# Patient Record
Sex: Female | Born: 1955 | Race: White | Hispanic: Yes | Smoking: Former smoker
Health system: Southern US, Community
[De-identification: ages and names within clinical notes are randomized; demographics above are authoritative.]

## PROBLEM LIST (undated history)

## (undated) DIAGNOSIS — E039 Hypothyroidism, unspecified: Secondary | ICD-10-CM

## (undated) DIAGNOSIS — R87619 Unspecified abnormal cytological findings in specimens from cervix uteri: Secondary | ICD-10-CM

## (undated) DIAGNOSIS — IMO0002 Reserved for concepts with insufficient information to code with codable children: Secondary | ICD-10-CM

## (undated) HISTORY — PX: TONSILLECTOMY AND ADENOIDECTOMY: SUR1326

## (undated) HISTORY — DX: Reserved for concepts with insufficient information to code with codable children: IMO0002

## (undated) HISTORY — DX: Hypothyroidism, unspecified: E03.9

## (undated) HISTORY — DX: Unspecified abnormal cytological findings in specimens from cervix uteri: R87.619

## (undated) HISTORY — PX: OTHER SURGICAL HISTORY: SHX169

---

## 1996-11-17 HISTORY — PX: BUNIONECTOMY: SHX129

## 1998-08-20 ENCOUNTER — Other Ambulatory Visit: Admission: RE | Admit: 1998-08-20 | Discharge: 1998-08-20 | Payer: Self-pay | Admitting: Obstetrics & Gynecology

## 2000-09-08 ENCOUNTER — Encounter: Payer: Self-pay | Admitting: Obstetrics & Gynecology

## 2000-09-08 ENCOUNTER — Ambulatory Visit (HOSPITAL_COMMUNITY): Admission: RE | Admit: 2000-09-08 | Discharge: 2000-09-08 | Payer: Self-pay | Admitting: Obstetrics & Gynecology

## 2001-09-09 ENCOUNTER — Ambulatory Visit (HOSPITAL_COMMUNITY): Admission: RE | Admit: 2001-09-09 | Discharge: 2001-09-09 | Payer: Self-pay | Admitting: Obstetrics and Gynecology

## 2001-09-09 ENCOUNTER — Encounter: Payer: Self-pay | Admitting: Obstetrics and Gynecology

## 2002-10-06 ENCOUNTER — Encounter: Payer: Self-pay | Admitting: Obstetrics and Gynecology

## 2002-10-06 ENCOUNTER — Ambulatory Visit (HOSPITAL_COMMUNITY): Admission: RE | Admit: 2002-10-06 | Discharge: 2002-10-06 | Payer: Self-pay | Admitting: Obstetrics and Gynecology

## 2004-05-30 ENCOUNTER — Ambulatory Visit (HOSPITAL_COMMUNITY): Admission: RE | Admit: 2004-05-30 | Discharge: 2004-05-30 | Payer: Self-pay | Admitting: Obstetrics and Gynecology

## 2004-06-13 ENCOUNTER — Other Ambulatory Visit: Admission: RE | Admit: 2004-06-13 | Discharge: 2004-06-13 | Payer: Self-pay | Admitting: Obstetrics and Gynecology

## 2004-06-13 DIAGNOSIS — E039 Hypothyroidism, unspecified: Secondary | ICD-10-CM

## 2004-06-13 HISTORY — DX: Hypothyroidism, unspecified: E03.9

## 2005-01-15 ENCOUNTER — Ambulatory Visit (HOSPITAL_COMMUNITY): Admission: RE | Admit: 2005-01-15 | Discharge: 2005-01-15 | Payer: Self-pay | Admitting: Family Medicine

## 2005-07-16 ENCOUNTER — Other Ambulatory Visit: Admission: RE | Admit: 2005-07-16 | Discharge: 2005-07-16 | Payer: Self-pay | Admitting: Obstetrics and Gynecology

## 2005-07-24 ENCOUNTER — Ambulatory Visit (HOSPITAL_COMMUNITY): Admission: RE | Admit: 2005-07-24 | Discharge: 2005-07-24 | Payer: Self-pay | Admitting: Obstetrics and Gynecology

## 2005-10-22 ENCOUNTER — Ambulatory Visit (HOSPITAL_COMMUNITY): Admission: RE | Admit: 2005-10-22 | Discharge: 2005-10-22 | Payer: Self-pay | Admitting: Gastroenterology

## 2006-03-17 ENCOUNTER — Other Ambulatory Visit: Admission: RE | Admit: 2006-03-17 | Discharge: 2006-03-17 | Payer: Self-pay | Admitting: Obstetrics and Gynecology

## 2006-07-28 ENCOUNTER — Ambulatory Visit (HOSPITAL_COMMUNITY): Admission: RE | Admit: 2006-07-28 | Discharge: 2006-07-28 | Payer: Self-pay | Admitting: Obstetrics and Gynecology

## 2006-08-06 ENCOUNTER — Other Ambulatory Visit: Admission: RE | Admit: 2006-08-06 | Discharge: 2006-08-06 | Payer: Self-pay | Admitting: Obstetrics and Gynecology

## 2007-08-10 ENCOUNTER — Ambulatory Visit (HOSPITAL_COMMUNITY): Admission: RE | Admit: 2007-08-10 | Discharge: 2007-08-10 | Payer: Self-pay | Admitting: Internal Medicine

## 2008-08-11 ENCOUNTER — Ambulatory Visit (HOSPITAL_COMMUNITY): Admission: RE | Admit: 2008-08-11 | Discharge: 2008-08-11 | Payer: Self-pay | Admitting: Obstetrics and Gynecology

## 2009-02-13 ENCOUNTER — Emergency Department (HOSPITAL_COMMUNITY): Admission: EM | Admit: 2009-02-13 | Discharge: 2009-02-13 | Payer: Self-pay | Admitting: Emergency Medicine

## 2011-02-27 LAB — URINE CULTURE: Colony Count: 6000

## 2011-02-27 LAB — URINALYSIS, ROUTINE W REFLEX MICROSCOPIC
Bilirubin Urine: NEGATIVE
Glucose, UA: NEGATIVE mg/dL
Ketones, ur: 15 mg/dL — AB
Leukocytes, UA: NEGATIVE
Nitrite: NEGATIVE
Protein, ur: NEGATIVE mg/dL
Specific Gravity, Urine: 1.012 (ref 1.005–1.030)
Urobilinogen, UA: 0.2 mg/dL (ref 0.0–1.0)
pH: 5.5 (ref 5.0–8.0)

## 2011-02-27 LAB — POCT I-STAT, CHEM 8
BUN: 27 mg/dL — ABNORMAL HIGH (ref 6–23)
Calcium, Ion: 1.14 mmol/L (ref 1.12–1.32)
Chloride: 103 mEq/L (ref 96–112)
Creatinine, Ser: 1.3 mg/dL — ABNORMAL HIGH (ref 0.4–1.2)
Glucose, Bld: 118 mg/dL — ABNORMAL HIGH (ref 70–99)
HCT: 39 % (ref 36.0–46.0)
Hemoglobin: 13.3 g/dL (ref 12.0–15.0)
Potassium: 4 mEq/L (ref 3.5–5.1)
Sodium: 137 meq/L (ref 135–145)
TCO2: 29 mmol/L (ref 0–100)

## 2011-02-27 LAB — PREGNANCY, URINE: Preg Test, Ur: NEGATIVE

## 2011-02-27 LAB — URINE MICROSCOPIC-ADD ON

## 2011-04-04 NOTE — Op Note (Signed)
[name], [name] NO.:  [id number]   MEDICAL RECORD NO.:  [id number]          PATIENT TYPE:  AMB   LOCATION:  ENDO                         FACILITY:  [facility]   PHYSICIAN:  Anselmo Rod, M.D.  DATE OF BIRTH:  09-18-1956   DATE OF PROCEDURE:  10/22/2005  DATE OF DISCHARGE:                                 OPERATIVE REPORT   PROCEDURE PERFORMED:  Screening colonoscopy.   ENDOSCOPIST:  Anselmo Rod, M.D.   INSTRUMENT USED:  Pediatric adjustable Olympus colonoscope.   INDICATION FOR PROCEDURE:  A 55 year old white female undergoing screening  colonoscopy to rule out colonic polyps, masses, etc.   PREPROCEDURE PREPARATION:  Informed consent was procured from the patient.  The patient was fasted for four hours prior to the procedure and prepped  with OsmoPrep the night of and in the morning of the procedure.  The risks  and benefits of the procedure, including a 10% miss rate of cancer and  polyps, were discussed with the patient as well.   PREPROCEDURE PREPARATION:  VITAL SIGNS:  The patient had stable vital signs.  NECK:  Supple.  CHEST:  Clear to auscultation.  S1, S2 regular.  ABDOMEN:  Soft with normal bowel sounds.   DESCRIPTION OF PROCEDURE:  The patient was placed in the left lateral  decubitus position and sedated with 70 mg of Demerol and 7 mg of Versed in  slow incremental doses.  Once the patient was adequately sedate and  maintained on low-flow oxygen and continuous cardiac monitoring, the Olympus  video colonoscope was advanced from the rectum to the cecum.  The  appendiceal orifice and the ileocecal valve were visualized and appeared  normal. No masses, erosions, ulcerations or diverticula were seen. The  terminal ileum appeared healthy and without lesions.  Retroflexion in the  rectum revealed no abnormalities.   IMPRESSION:  Normal colonoscopy up to the terminal ileum.  No masses, polyps  or diverticula seen.   RECOMMENDATIONS:   1.Continue a high-fiber diet with liberal fluid intake.  2.Repeat colonoscopy in the next 10 years unless the patient develops any  abnormal symptoms in the interim.  3.Outpatient follow-up as the need arises in the future.      Anselmo Rod, M.D.  Electronically Signed     JNM/MEDQ  D:  10/22/2005  T:  10/22/2005  Job:  [id number]   cc:   Dois Davenport A. Rivard, M.D.  Fax: [phone]

## 2012-04-02 ENCOUNTER — Other Ambulatory Visit: Payer: Self-pay | Admitting: Obstetrics and Gynecology

## 2012-04-02 DIAGNOSIS — Z1231 Encounter for screening mammogram for malignant neoplasm of breast: Secondary | ICD-10-CM

## 2012-04-06 ENCOUNTER — Encounter: Payer: Self-pay | Admitting: Obstetrics and Gynecology

## 2012-04-06 ENCOUNTER — Ambulatory Visit (INDEPENDENT_AMBULATORY_CARE_PROVIDER_SITE_OTHER): Payer: BC Managed Care – PPO | Admitting: Obstetrics and Gynecology

## 2012-04-06 VITALS — BP 104/66 | Resp 16 | Ht 61.75 in | Wt 134.0 lb

## 2012-04-06 DIAGNOSIS — Z01419 Encounter for gynecological examination (general) (routine) without abnormal findings: Secondary | ICD-10-CM

## 2012-04-06 DIAGNOSIS — E039 Hypothyroidism, unspecified: Secondary | ICD-10-CM

## 2012-04-06 DIAGNOSIS — Z124 Encounter for screening for malignant neoplasm of cervix: Secondary | ICD-10-CM

## 2012-04-06 NOTE — Patient Instructions (Signed)
Patient Education Materials to be provided at check out (*indicates is located in accordion folder):  Kegels  

## 2012-04-06 NOTE — Progress Notes (Signed)
The patient is not taking hormone replacement therapy The patient  is not taking a Calcium supplement. Post-menopausal bleeding: no  Last Pap: 09/12/2008 Normal Last mammogram: 01/2011 Normal Last DEXA scan : None Last colonoscopy:10/22/2005 Normal Dr [address]  Urinary symptoms: occasional USI if full bladder and exercising or laughing Normal bowel movements: Yes Reports abuse at home: No   Subjective:    Carrie Becker is a 56 y.o. female [id number] who presents for annual exam. Menopause at 34.No use of HRT The patient has no complaints.  The following portions of the patient's history were reviewed and updated as appropriate: allergies, current medications, past family history, past medical history, past social history, past surgical history and problem list.  Review of Systems Pertinent items are noted in HPI. Gastrointestinal:No change in bowel habits, no abdominal pain, no rectal bleeding Genitourinary:negative for dysuria, frequency, hematuria, nocturia and urinary incontinence    Objective:     BP 104/66  Resp 16  Ht 5' 1.75" (1.568 m)  Wt 134 lb (60.782 kg)  BMI 24.71 kg/m2  Weight:  Wt Readings from Last 1 Encounters:  04/06/12 134 lb (60.782 kg)     BMI: Body mass index is 24.71 kg/(m^2). General Appearance: Alert, appropriate appearance for age. No acute distress HEENT: Grossly normal Neck / Thyroid: Supple, no masses, nodes or enlargement Lungs: clear to auscultation bilaterally Back: No CVA tenderness Breast Exam: No dimpling, nipple retraction or discharge. No masses or nodes., Normal to inspection and No masses or nodes.No dimpling, nipple retraction or discharge. Cardiovascular: Regular rate and rhythm. S1, S2, no murmur Gastrointestinal: Soft, non-tender, no masses or organomegaly Pelvic Exam: Vulva and vagina appear normal. Bimanual exam reveals normal uterus and adnexa. Rectovaginal: normal rectal, no masses Lymphatic Exam: Non-palpable nodes in neck,  clavicular, axillary, or inguinal regions Skin: no rash or abnormalities Neurologic: Normal gait and speech, no tremor  Psychiatric: Alert and oriented, appropriate affect.         Assessment:    Normal gyn exam    Plan:    Await pap smear results. Mammogram. scheduled next month  Follow-up:  for annual exam

## 2012-04-09 LAB — PAP IG W/ RFLX HPV ASCU

## 2012-04-28 ENCOUNTER — Ambulatory Visit (HOSPITAL_COMMUNITY): Payer: Self-pay

## 2012-04-29 ENCOUNTER — Ambulatory Visit (HOSPITAL_COMMUNITY)
Admission: RE | Admit: 2012-04-29 | Discharge: 2012-04-29 | Disposition: A | Discharge status: Home / Self Care | Payer: BC Managed Care – PPO | Source: Ambulatory Visit | Attending: Obstetrics and Gynecology | Admitting: Obstetrics and Gynecology

## 2012-04-29 DIAGNOSIS — Z1231 Encounter for screening mammogram for malignant neoplasm of breast: Secondary | ICD-10-CM | POA: Insufficient documentation

## 2013-04-04 ENCOUNTER — Other Ambulatory Visit: Payer: Self-pay | Admitting: Obstetrics and Gynecology

## 2013-04-04 DIAGNOSIS — Z1231 Encounter for screening mammogram for malignant neoplasm of breast: Secondary | ICD-10-CM

## 2013-05-17 ENCOUNTER — Ambulatory Visit (HOSPITAL_COMMUNITY)
Admission: RE | Admit: 2013-05-17 | Discharge: 2013-05-17 | Disposition: A | Discharge status: Home / Self Care | Payer: BC Managed Care – PPO | Source: Ambulatory Visit | Attending: Obstetrics and Gynecology | Admitting: Obstetrics and Gynecology

## 2013-05-17 DIAGNOSIS — Z1231 Encounter for screening mammogram for malignant neoplasm of breast: Secondary | ICD-10-CM

## 2014-09-18 ENCOUNTER — Encounter: Payer: Self-pay | Admitting: Obstetrics and Gynecology

## 2014-09-19 ENCOUNTER — Other Ambulatory Visit (HOSPITAL_COMMUNITY): Payer: Self-pay | Admitting: Family Medicine

## 2014-09-19 DIAGNOSIS — Z1231 Encounter for screening mammogram for malignant neoplasm of breast: Secondary | ICD-10-CM

## 2014-09-21 ENCOUNTER — Ambulatory Visit (HOSPITAL_COMMUNITY)
Admission: RE | Admit: 2014-09-21 | Discharge: 2014-09-21 | Disposition: A | Discharge status: Home / Self Care | Payer: BC Managed Care – PPO | Source: Ambulatory Visit | Attending: Family Medicine | Admitting: Family Medicine

## 2014-09-21 DIAGNOSIS — Z1231 Encounter for screening mammogram for malignant neoplasm of breast: Secondary | ICD-10-CM | POA: Insufficient documentation

## 2016-12-09 DIAGNOSIS — Z719 Counseling, unspecified: Secondary | ICD-10-CM | POA: Diagnosis not present

## 2017-02-17 DIAGNOSIS — J069 Acute upper respiratory infection, unspecified: Secondary | ICD-10-CM | POA: Diagnosis not present

## 2017-02-17 DIAGNOSIS — H6092 Unspecified otitis externa, left ear: Secondary | ICD-10-CM | POA: Diagnosis not present

## 2017-02-22 DIAGNOSIS — H6502 Acute serous otitis media, left ear: Secondary | ICD-10-CM | POA: Diagnosis not present

## 2017-02-22 DIAGNOSIS — J069 Acute upper respiratory infection, unspecified: Secondary | ICD-10-CM | POA: Diagnosis not present

## 2017-04-01 DIAGNOSIS — S80862A Insect bite (nonvenomous), left lower leg, initial encounter: Secondary | ICD-10-CM | POA: Diagnosis not present

## 2017-04-06 DIAGNOSIS — Z23 Encounter for immunization: Secondary | ICD-10-CM | POA: Diagnosis not present

## 2017-06-29 DIAGNOSIS — H35413 Lattice degeneration of retina, bilateral: Secondary | ICD-10-CM | POA: Diagnosis not present

## 2017-06-29 DIAGNOSIS — H33312 Horseshoe tear of retina without detachment, left eye: Secondary | ICD-10-CM | POA: Diagnosis not present

## 2017-06-29 DIAGNOSIS — H2513 Age-related nuclear cataract, bilateral: Secondary | ICD-10-CM | POA: Diagnosis not present

## 2017-07-06 DIAGNOSIS — H2512 Age-related nuclear cataract, left eye: Secondary | ICD-10-CM | POA: Diagnosis not present

## 2017-07-06 DIAGNOSIS — H35419 Lattice degeneration of retina, unspecified eye: Secondary | ICD-10-CM | POA: Diagnosis not present

## 2017-07-06 DIAGNOSIS — H2511 Age-related nuclear cataract, right eye: Secondary | ICD-10-CM | POA: Diagnosis not present

## 2017-08-05 DIAGNOSIS — H2511 Age-related nuclear cataract, right eye: Secondary | ICD-10-CM | POA: Diagnosis not present

## 2017-08-05 DIAGNOSIS — H25811 Combined forms of age-related cataract, right eye: Secondary | ICD-10-CM | POA: Diagnosis not present

## 2017-08-18 DIAGNOSIS — Z23 Encounter for immunization: Secondary | ICD-10-CM | POA: Diagnosis not present

## 2017-09-07 DIAGNOSIS — H2512 Age-related nuclear cataract, left eye: Secondary | ICD-10-CM | POA: Diagnosis not present

## 2017-09-16 DIAGNOSIS — H25812 Combined forms of age-related cataract, left eye: Secondary | ICD-10-CM | POA: Diagnosis not present

## 2017-09-16 DIAGNOSIS — H2512 Age-related nuclear cataract, left eye: Secondary | ICD-10-CM | POA: Diagnosis not present

## 2017-12-08 DIAGNOSIS — L299 Pruritus, unspecified: Secondary | ICD-10-CM | POA: Diagnosis not present

## 2017-12-08 DIAGNOSIS — Z2089 Contact with and (suspected) exposure to other communicable diseases: Secondary | ICD-10-CM | POA: Diagnosis not present

## 2017-12-29 DIAGNOSIS — Z01419 Encounter for gynecological examination (general) (routine) without abnormal findings: Secondary | ICD-10-CM | POA: Diagnosis not present

## 2017-12-29 DIAGNOSIS — Z124 Encounter for screening for malignant neoplasm of cervix: Secondary | ICD-10-CM | POA: Diagnosis not present

## 2018-01-07 DIAGNOSIS — M8580 Other specified disorders of bone density and structure, unspecified site: Secondary | ICD-10-CM | POA: Diagnosis not present

## 2018-04-20 DIAGNOSIS — H209 Unspecified iridocyclitis: Secondary | ICD-10-CM | POA: Diagnosis not present

## 2018-04-20 DIAGNOSIS — Z961 Presence of intraocular lens: Secondary | ICD-10-CM | POA: Diagnosis not present

## 2018-04-21 DIAGNOSIS — E559 Vitamin D deficiency, unspecified: Secondary | ICD-10-CM | POA: Diagnosis not present

## 2018-04-26 ENCOUNTER — Other Ambulatory Visit: Payer: Self-pay | Admitting: Family Medicine

## 2018-04-26 ENCOUNTER — Ambulatory Visit
Admission: RE | Admit: 2018-04-26 | Discharge: 2018-04-26 | Disposition: A | Discharge status: Home / Self Care | Payer: 59 | Source: Ambulatory Visit | Attending: Family Medicine | Admitting: Family Medicine

## 2018-04-26 DIAGNOSIS — M545 Low back pain, unspecified: Secondary | ICD-10-CM

## 2018-04-26 DIAGNOSIS — Z87442 Personal history of urinary calculi: Secondary | ICD-10-CM | POA: Diagnosis not present

## 2018-04-26 DIAGNOSIS — E039 Hypothyroidism, unspecified: Secondary | ICD-10-CM | POA: Diagnosis not present

## 2018-04-26 DIAGNOSIS — R109 Unspecified abdominal pain: Secondary | ICD-10-CM | POA: Diagnosis not present

## 2018-04-27 DIAGNOSIS — Z961 Presence of intraocular lens: Secondary | ICD-10-CM | POA: Diagnosis not present

## 2018-04-27 DIAGNOSIS — H209 Unspecified iridocyclitis: Secondary | ICD-10-CM | POA: Diagnosis not present

## 2018-05-06 DIAGNOSIS — H209 Unspecified iridocyclitis: Secondary | ICD-10-CM | POA: Diagnosis not present

## 2018-05-06 DIAGNOSIS — Z961 Presence of intraocular lens: Secondary | ICD-10-CM | POA: Diagnosis not present

## 2018-06-13 DIAGNOSIS — J029 Acute pharyngitis, unspecified: Secondary | ICD-10-CM | POA: Diagnosis not present

## 2018-06-13 DIAGNOSIS — R05 Cough: Secondary | ICD-10-CM | POA: Diagnosis not present

## 2018-06-21 DIAGNOSIS — H209 Unspecified iridocyclitis: Secondary | ICD-10-CM | POA: Diagnosis not present

## 2018-06-21 DIAGNOSIS — Z961 Presence of intraocular lens: Secondary | ICD-10-CM | POA: Diagnosis not present

## 2018-07-30 DIAGNOSIS — E559 Vitamin D deficiency, unspecified: Secondary | ICD-10-CM | POA: Diagnosis not present

## 2018-08-31 DIAGNOSIS — Z23 Encounter for immunization: Secondary | ICD-10-CM | POA: Diagnosis not present

## 2018-09-24 DIAGNOSIS — S0512XA Contusion of eyeball and orbital tissues, left eye, initial encounter: Secondary | ICD-10-CM | POA: Diagnosis not present

## 2018-09-24 DIAGNOSIS — H04123 Dry eye syndrome of bilateral lacrimal glands: Secondary | ICD-10-CM | POA: Diagnosis not present

## 2018-09-24 DIAGNOSIS — H43812 Vitreous degeneration, left eye: Secondary | ICD-10-CM | POA: Diagnosis not present

## 2018-11-13 DIAGNOSIS — J209 Acute bronchitis, unspecified: Secondary | ICD-10-CM | POA: Diagnosis not present

## 2018-11-19 DIAGNOSIS — Z23 Encounter for immunization: Secondary | ICD-10-CM | POA: Diagnosis not present

## 2018-11-19 DIAGNOSIS — J209 Acute bronchitis, unspecified: Secondary | ICD-10-CM | POA: Diagnosis not present

## 2018-11-19 DIAGNOSIS — J9801 Acute bronchospasm: Secondary | ICD-10-CM | POA: Diagnosis not present

## 2018-12-06 DIAGNOSIS — G4700 Insomnia, unspecified: Secondary | ICD-10-CM | POA: Diagnosis not present

## 2018-12-06 DIAGNOSIS — Z0000 Encounter for general adult medical examination without abnormal findings: Secondary | ICD-10-CM | POA: Diagnosis not present

## 2018-12-06 DIAGNOSIS — Z13220 Encounter for screening for lipoid disorders: Secondary | ICD-10-CM | POA: Diagnosis not present

## 2018-12-06 DIAGNOSIS — A09 Infectious gastroenteritis and colitis, unspecified: Secondary | ICD-10-CM | POA: Diagnosis not present

## 2018-12-06 DIAGNOSIS — E039 Hypothyroidism, unspecified: Secondary | ICD-10-CM | POA: Diagnosis not present

## 2018-12-17 DIAGNOSIS — Z23 Encounter for immunization: Secondary | ICD-10-CM | POA: Diagnosis not present

## 2019-01-11 DIAGNOSIS — Z1231 Encounter for screening mammogram for malignant neoplasm of breast: Secondary | ICD-10-CM | POA: Diagnosis not present

## 2019-02-18 DIAGNOSIS — Z23 Encounter for immunization: Secondary | ICD-10-CM | POA: Diagnosis not present

## 2019-11-08 IMAGING — CR DG ABDOMEN 1V
1 series · 1 of 1 positions shown · non-contrast
Comparison: CT 02/13/2009.

CLINICAL DATA: Right-sided flank pain.

EXAM:
ABDOMEN - 1 VIEW

[w abdomen upright]
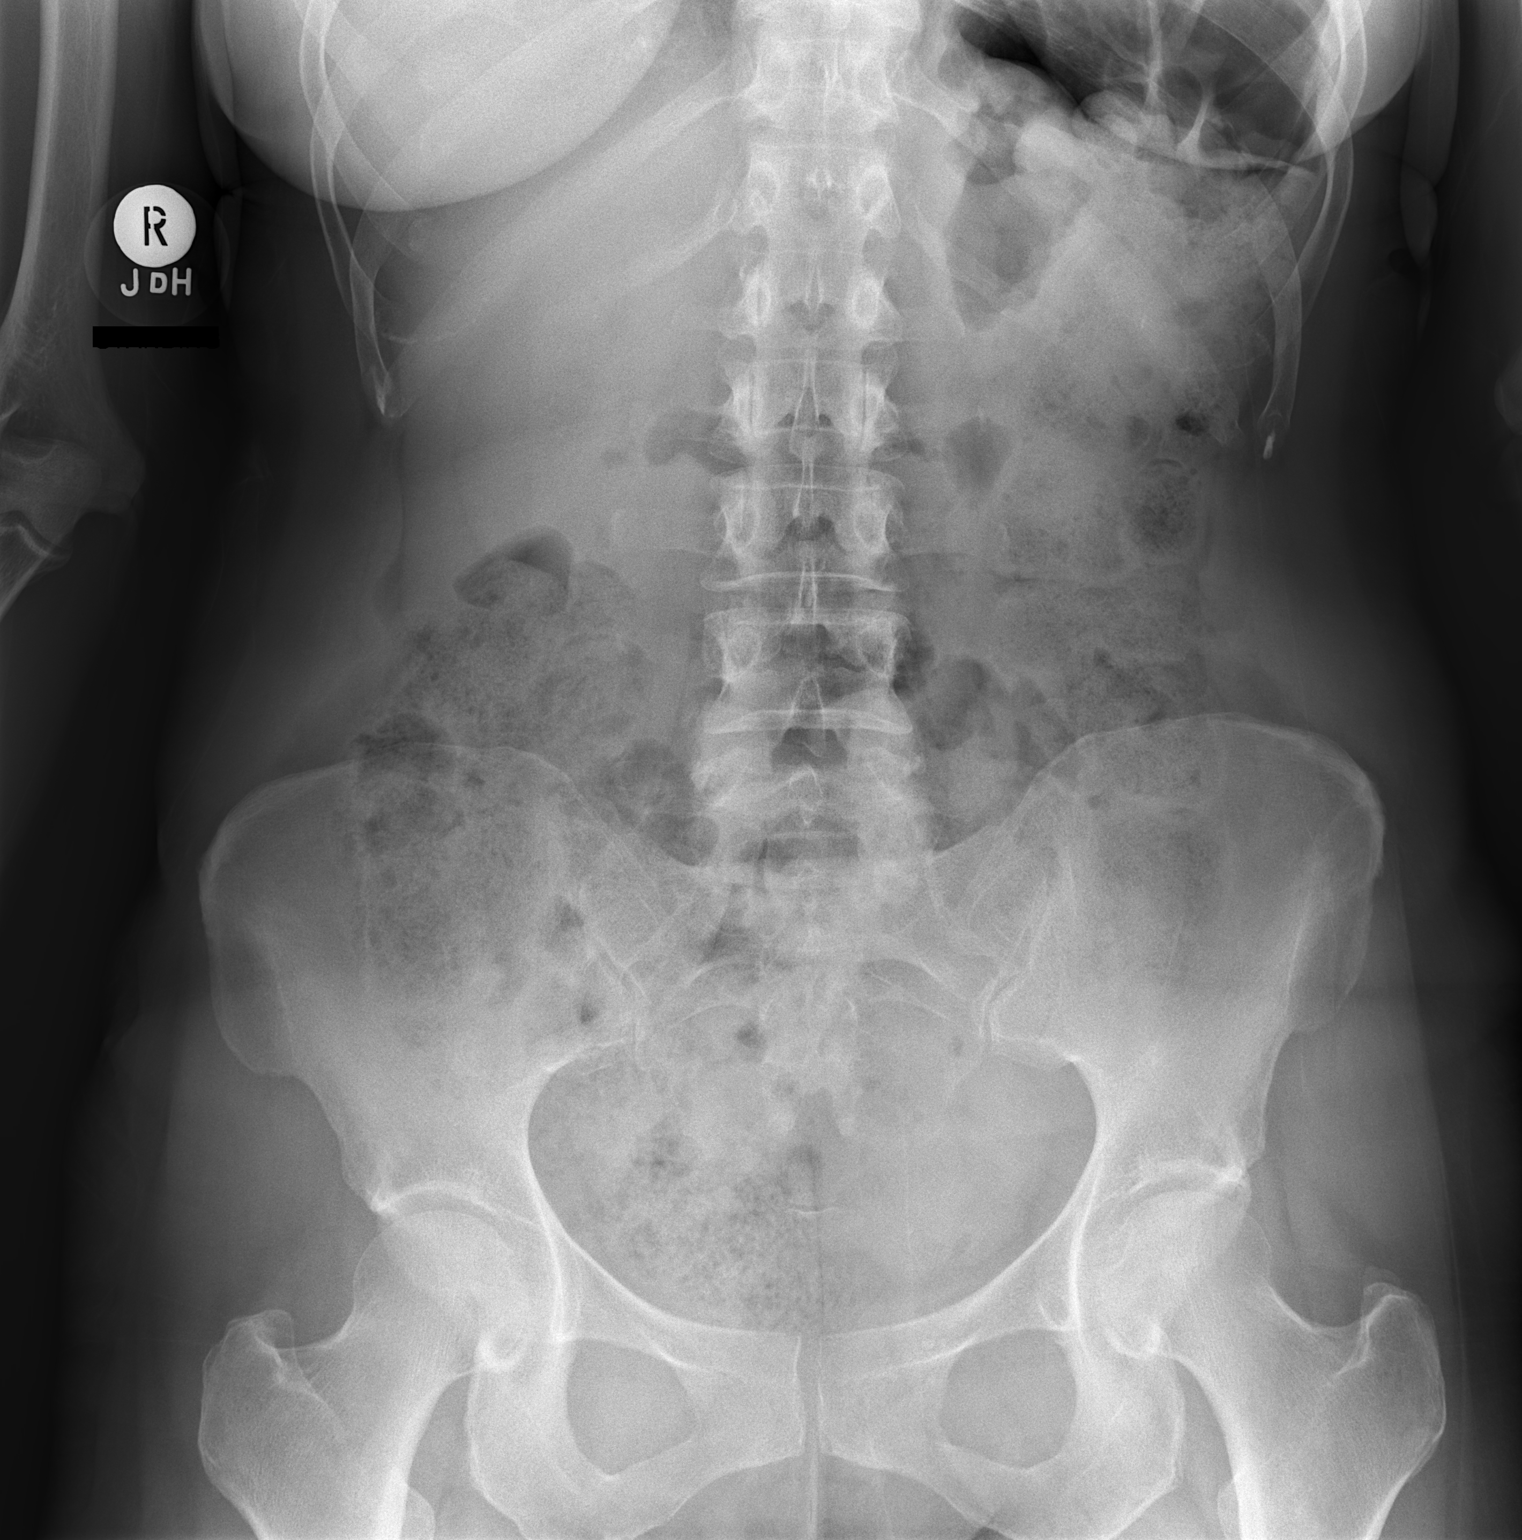

[1 of 1 positions shown; findings below may reference images not displayed]

FINDINGS: Soft tissue structures are unremarkable. Stool noted throughout the
colon. No bowel distention or free air. No evidence of ureteral
stone. Degenerative changes lumbar spine and both hips. No acute
bony abnormality.
IMPRESSION: 1. Stool noted throughout the colon. Constipation could present this
fashion. No bowel distention.

2. No acute abnormalities identified. No evidence of ureteral stone.

## 2020-06-11 DIAGNOSIS — Z13220 Encounter for screening for lipoid disorders: Secondary | ICD-10-CM | POA: Diagnosis not present

## 2020-06-11 DIAGNOSIS — Z23 Encounter for immunization: Secondary | ICD-10-CM | POA: Diagnosis not present

## 2020-06-11 DIAGNOSIS — G4700 Insomnia, unspecified: Secondary | ICD-10-CM | POA: Diagnosis not present

## 2020-06-11 DIAGNOSIS — E039 Hypothyroidism, unspecified: Secondary | ICD-10-CM | POA: Diagnosis not present

## 2020-06-11 DIAGNOSIS — Z1389 Encounter for screening for other disorder: Secondary | ICD-10-CM | POA: Diagnosis not present

## 2020-06-11 DIAGNOSIS — Z0000 Encounter for general adult medical examination without abnormal findings: Secondary | ICD-10-CM | POA: Diagnosis not present

## 2020-06-26 DIAGNOSIS — H0102A Squamous blepharitis right eye, upper and lower eyelids: Secondary | ICD-10-CM | POA: Diagnosis not present

## 2020-06-26 DIAGNOSIS — H35413 Lattice degeneration of retina, bilateral: Secondary | ICD-10-CM | POA: Diagnosis not present

## 2020-06-26 DIAGNOSIS — H0102B Squamous blepharitis left eye, upper and lower eyelids: Secondary | ICD-10-CM | POA: Diagnosis not present

## 2020-06-26 DIAGNOSIS — H5203 Hypermetropia, bilateral: Secondary | ICD-10-CM | POA: Diagnosis not present

## 2020-06-26 DIAGNOSIS — H43813 Vitreous degeneration, bilateral: Secondary | ICD-10-CM | POA: Diagnosis not present

## 2020-07-07 ENCOUNTER — Other Ambulatory Visit: Payer: Self-pay

## 2020-07-07 ENCOUNTER — Emergency Department (HOSPITAL_COMMUNITY)
Admission: EM | Admit: 2020-07-07 | Discharge: 2020-07-07 | Disposition: A | Discharge status: Home / Self Care | Payer: BC Managed Care – PPO | Attending: Emergency Medicine | Admitting: Emergency Medicine

## 2020-07-07 ENCOUNTER — Encounter (HOSPITAL_COMMUNITY): Payer: Self-pay

## 2020-07-07 DIAGNOSIS — E039 Hypothyroidism, unspecified: Secondary | ICD-10-CM | POA: Diagnosis not present

## 2020-07-07 DIAGNOSIS — Z20822 Contact with and (suspected) exposure to covid-19: Secondary | ICD-10-CM

## 2020-07-07 DIAGNOSIS — Z87891 Personal history of nicotine dependence: Secondary | ICD-10-CM | POA: Insufficient documentation

## 2020-07-07 DIAGNOSIS — Z79890 Hormone replacement therapy (postmenopausal): Secondary | ICD-10-CM | POA: Diagnosis not present

## 2020-07-07 DIAGNOSIS — Z03818 Encounter for observation for suspected exposure to other biological agents ruled out: Secondary | ICD-10-CM | POA: Diagnosis not present

## 2020-07-07 LAB — SARS CORONAVIRUS 2 BY RT PCR (HOSPITAL ORDER, PERFORMED IN ~~LOC~~ HOSPITAL LAB): SARS Coronavirus 2: NEGATIVE

## 2020-07-07 NOTE — ED Provider Notes (Signed)
[facility]-EMERGENCY DEPT Provider Note   CSN: [id number] Arrival date & time: 07/07/20  1858     History Chief Complaint  Patient presents with  . Covid Exposure    Carrie Becker is a 64 y.o. female.  Patient indicates she was recently around her daughter who subsequently tested positive for covid, so she would like covid test done as possible exposure. States otherwise she feels fine/normal. No fever/chills. No cough or uri symptoms. No sob. No body aches. No change in taste/smell. Walked/hiked 17 miles today, and felt normal.   The history is provided by the patient.       Past Medical History:  Diagnosis Date  . Abnormal Pap smear   . Hypothyroidism 06/13/2004    Patient Active Problem List   Diagnosis Date Noted  . Hypothyroidism 04/06/2012    Past Surgical History:  Procedure Laterality Date  . BUNIONECTOMY  1998  . lasix    . TONSILLECTOMY AND ADENOIDECTOMY       OB History    Gravida  3   Para  2   Term  2   Preterm      AB  1   Living  2     SAB  1   TAB      Ectopic      Multiple      Live Births  2           Family History  Problem Relation Age of Onset  . Diabetes Maternal Grandmother   . Breast cancer Mother 32       survivor > 10 years    Social History   Tobacco Use  . Smoking status: Former Smoker    Packs/day: 1.00    Types: Cigarettes    Quit date: 04/06/1981    Years since quitting: 39.2  . Smokeless tobacco: Never Used  Substance Use Topics  . Alcohol use: Yes    Alcohol/week: 1.0 standard drink    Types: 1 Glasses of wine per week  . Drug use: No    Home Medications Prior to Admission medications   Medication Sig Start Date End Date Taking? Authorizing Provider  cholecalciferol (VITAMIN D) 1000 UNITS tablet Take 1,000 Units by mouth continuous as needed.    [provider]  fish oil-omega-3 fatty acids 1000 MG capsule Take 2 g by mouth continuous as needed.    [provider]  HYDROcodone-acetaminophen (VICODIN) 5-500 MG per tablet Take 1 tablet by mouth every 6 (six) hours as needed.    [provider]  levothyroxine (SYNTHROID, LEVOTHROID) 125 MCG tablet Take 125 mcg by mouth daily.    [provider]  meloxicam (MOBIC) 7.5 MG tablet Take 7.5 mg by mouth daily.    [provider]  zolpidem (AMBIEN) 5 MG tablet Take 5 mg by mouth at bedtime as needed.    [provider]    Allergies    Patient has no known allergies.  Review of Systems   Review of Systems  Constitutional: Negative for fever.  Respiratory: Negative for cough and shortness of breath.   Musculoskeletal: Negative for myalgias.    Physical Exam Updated Vital Signs BP (!) 151/87 (BP Location: Left Arm)   Pulse 71   Temp 98.3 F (36.8 C) (Oral)   Resp 16   Ht 1.575 m (5\' 2" )   Wt 54 kg   SpO2 100%   BMI 21.77 kg/m   Physical Exam Vitals and nursing  note reviewed.  Constitutional:      Appearance: Normal appearance. She is well-developed.  HENT:     Head: Atraumatic.     Nose: Nose normal.  Eyes:     General: No scleral icterus.    Conjunctiva/sclera: Conjunctivae normal.  Neck:     Trachea: No tracheal deviation.  Cardiovascular:     Rate and Rhythm: Normal rate and regular rhythm.     Heart sounds: Normal heart sounds.  Pulmonary:     Effort: Pulmonary effort is normal. No respiratory distress.     Breath sounds: Normal breath sounds.  Genitourinary:    Comments: No cva tenderness.  Musculoskeletal:        General: No swelling.     Cervical back: Neck supple. No muscular tenderness.  Skin:    General: Skin is warm and dry.     Findings: No rash.  Neurological:     Mental Status: She is alert.     Comments: Alert, speech normal.   Psychiatric:        Mood and Affect: Mood normal.     ED Results / Procedures / Treatments   Labs (all labs ordered are listed, but only abnormal results are displayed) Labs Reviewed    SARS CORONAVIRUS 2 BY RT PCR (HOSPITAL ORDER, PERFORMED IN [facility])    EKG None  Radiology No results found.  Procedures Procedures (including critical care time)  Medications Ordered in ED Medications - No data to display  ED Course  I have reviewed the triage vital signs and the nursing notes.  Pertinent labs & imaging results that were available during my care of the patient were reviewed by me and considered in my medical decision making (see chart for details).    MDM Rules/Calculators/A&P                          Covid swab sent.  Carrie Becker was evaluated in Emergency Department on 07/07/2020 for the symptoms described in the history of present illness. She was evaluated in the context of the global COVID-19 pandemic, which necessitated consideration that the patient might be at risk for infection with the SARS-CoV-2 virus that causes COVID-19. Institutional protocols and algorithms that pertain to the evaluation of patients at risk for COVID-19 are in a state of rapid change based on information released by regulatory bodies including the CDC and federal and state organizations. These policies and algorithms were followed during the patient's care in the ED.  Discussed w pt, may call or check My Chart for results.     Final Clinical Impression(s) / ED Diagnoses Final diagnoses:  Encounter for laboratory testing for COVID-19 virus    Rx / DC Orders ED Discharge Orders    None       Cathren Laine, MD 07/07/20 2019

## 2020-07-07 NOTE — ED Triage Notes (Addendum)
Patient reports she came to get checked for COVID since she had to bring her mother here. Patient's daughter tested positive for COVID. Patient is asymptomatic

## 2020-07-07 NOTE — Discharge Instructions (Addendum)
It was our pleasure to provide your ER care today - we hope that you feel better.  Your test should result ~ 2-3 hours after swab taken - you may call for results, or check My Chart.   Return to ER if worse, new symptoms, trouble breathing, or other concern.

## 2020-07-17 DIAGNOSIS — M25511 Pain in right shoulder: Secondary | ICD-10-CM | POA: Diagnosis not present

## 2020-07-25 DIAGNOSIS — M25511 Pain in right shoulder: Secondary | ICD-10-CM | POA: Diagnosis not present

## 2020-07-31 DIAGNOSIS — M25511 Pain in right shoulder: Secondary | ICD-10-CM | POA: Diagnosis not present

## 2020-08-07 DIAGNOSIS — M25511 Pain in right shoulder: Secondary | ICD-10-CM | POA: Diagnosis not present

## 2020-08-14 DIAGNOSIS — M25511 Pain in right shoulder: Secondary | ICD-10-CM | POA: Diagnosis not present

## 2020-08-20 DIAGNOSIS — M25511 Pain in right shoulder: Secondary | ICD-10-CM | POA: Diagnosis not present

## 2020-08-27 DIAGNOSIS — M25511 Pain in right shoulder: Secondary | ICD-10-CM | POA: Diagnosis not present

## 2020-11-26 DIAGNOSIS — R030 Elevated blood-pressure reading, without diagnosis of hypertension: Secondary | ICD-10-CM | POA: Diagnosis not present

## 2020-11-26 DIAGNOSIS — K0510 Chronic gingivitis, plaque induced: Secondary | ICD-10-CM | POA: Diagnosis not present

## 2021-03-15 DIAGNOSIS — M8580 Other specified disorders of bone density and structure, unspecified site: Secondary | ICD-10-CM | POA: Diagnosis not present

## 2021-03-15 DIAGNOSIS — Z01419 Encounter for gynecological examination (general) (routine) without abnormal findings: Secondary | ICD-10-CM | POA: Diagnosis not present

## 2021-03-15 DIAGNOSIS — Z1211 Encounter for screening for malignant neoplasm of colon: Secondary | ICD-10-CM | POA: Diagnosis not present

## 2021-03-15 DIAGNOSIS — M8589 Other specified disorders of bone density and structure, multiple sites: Secondary | ICD-10-CM | POA: Diagnosis not present

## 2021-03-15 DIAGNOSIS — E559 Vitamin D deficiency, unspecified: Secondary | ICD-10-CM | POA: Diagnosis not present

## 2021-03-15 DIAGNOSIS — Z1231 Encounter for screening mammogram for malignant neoplasm of breast: Secondary | ICD-10-CM | POA: Diagnosis not present

## 2021-04-09 DIAGNOSIS — D1801 Hemangioma of skin and subcutaneous tissue: Secondary | ICD-10-CM | POA: Diagnosis not present

## 2021-04-09 DIAGNOSIS — L821 Other seborrheic keratosis: Secondary | ICD-10-CM | POA: Diagnosis not present

## 2021-04-09 DIAGNOSIS — L814 Other melanin hyperpigmentation: Secondary | ICD-10-CM | POA: Diagnosis not present

## 2022-12-03 DIAGNOSIS — R69 Illness, unspecified: Secondary | ICD-10-CM | POA: Diagnosis not present

## 2022-12-03 DIAGNOSIS — M545 Low back pain, unspecified: Secondary | ICD-10-CM | POA: Diagnosis not present

## 2022-12-29 DIAGNOSIS — Z0100 Encounter for examination of eyes and vision without abnormal findings: Secondary | ICD-10-CM | POA: Diagnosis not present

## 2023-03-23 DIAGNOSIS — Z124 Encounter for screening for malignant neoplasm of cervix: Secondary | ICD-10-CM | POA: Diagnosis not present

## 2023-03-23 DIAGNOSIS — Z1231 Encounter for screening mammogram for malignant neoplasm of breast: Secondary | ICD-10-CM | POA: Diagnosis not present

## 2023-03-23 DIAGNOSIS — E559 Vitamin D deficiency, unspecified: Secondary | ICD-10-CM | POA: Diagnosis not present

## 2023-03-23 DIAGNOSIS — M8580 Other specified disorders of bone density and structure, unspecified site: Secondary | ICD-10-CM | POA: Diagnosis not present

## 2023-03-23 DIAGNOSIS — Z01419 Encounter for gynecological examination (general) (routine) without abnormal findings: Secondary | ICD-10-CM | POA: Diagnosis not present

## 2023-03-23 DIAGNOSIS — Z6825 Body mass index (BMI) 25.0-25.9, adult: Secondary | ICD-10-CM | POA: Diagnosis not present

## 2023-03-23 DIAGNOSIS — Z1211 Encounter for screening for malignant neoplasm of colon: Secondary | ICD-10-CM | POA: Diagnosis not present

## 2023-03-23 DIAGNOSIS — R87810 Cervical high risk human papillomavirus (HPV) DNA test positive: Secondary | ICD-10-CM | POA: Diagnosis not present

## 2023-03-24 ENCOUNTER — Other Ambulatory Visit: Payer: Self-pay | Admitting: Obstetrics and Gynecology

## 2023-03-24 DIAGNOSIS — M8580 Other specified disorders of bone density and structure, unspecified site: Secondary | ICD-10-CM

## 2023-03-30 DIAGNOSIS — M2011 Hallux valgus (acquired), right foot: Secondary | ICD-10-CM | POA: Diagnosis not present

## 2023-03-30 DIAGNOSIS — M2042 Other hammer toe(s) (acquired), left foot: Secondary | ICD-10-CM | POA: Diagnosis not present

## 2023-03-30 DIAGNOSIS — M2012 Hallux valgus (acquired), left foot: Secondary | ICD-10-CM | POA: Diagnosis not present

## 2023-03-30 DIAGNOSIS — M792 Neuralgia and neuritis, unspecified: Secondary | ICD-10-CM | POA: Diagnosis not present

## 2023-03-30 DIAGNOSIS — M2041 Other hammer toe(s) (acquired), right foot: Secondary | ICD-10-CM | POA: Diagnosis not present

## 2023-03-30 DIAGNOSIS — M65871 Other synovitis and tenosynovitis, right ankle and foot: Secondary | ICD-10-CM | POA: Diagnosis not present

## 2023-04-13 DIAGNOSIS — U071 COVID-19: Secondary | ICD-10-CM | POA: Diagnosis not present

## 2023-04-21 DIAGNOSIS — M2011 Hallux valgus (acquired), right foot: Secondary | ICD-10-CM | POA: Diagnosis not present

## 2023-04-21 DIAGNOSIS — M792 Neuralgia and neuritis, unspecified: Secondary | ICD-10-CM | POA: Diagnosis not present

## 2023-04-21 DIAGNOSIS — M65871 Other synovitis and tenosynovitis, right ankle and foot: Secondary | ICD-10-CM | POA: Diagnosis not present

## 2023-04-21 DIAGNOSIS — M2012 Hallux valgus (acquired), left foot: Secondary | ICD-10-CM | POA: Diagnosis not present

## 2023-04-21 DIAGNOSIS — M2041 Other hammer toe(s) (acquired), right foot: Secondary | ICD-10-CM | POA: Diagnosis not present

## 2023-04-21 DIAGNOSIS — M2042 Other hammer toe(s) (acquired), left foot: Secondary | ICD-10-CM | POA: Diagnosis not present

## 2023-07-06 DIAGNOSIS — R69 Illness, unspecified: Secondary | ICD-10-CM | POA: Diagnosis not present

## 2023-07-09 DIAGNOSIS — H0102A Squamous blepharitis right eye, upper and lower eyelids: Secondary | ICD-10-CM | POA: Diagnosis not present

## 2023-07-09 DIAGNOSIS — H43813 Vitreous degeneration, bilateral: Secondary | ICD-10-CM | POA: Diagnosis not present

## 2023-07-09 DIAGNOSIS — Z961 Presence of intraocular lens: Secondary | ICD-10-CM | POA: Diagnosis not present

## 2023-07-09 DIAGNOSIS — H0102B Squamous blepharitis left eye, upper and lower eyelids: Secondary | ICD-10-CM | POA: Diagnosis not present

## 2023-07-09 DIAGNOSIS — H33302 Unspecified retinal break, left eye: Secondary | ICD-10-CM | POA: Diagnosis not present

## 2023-07-09 DIAGNOSIS — H04123 Dry eye syndrome of bilateral lacrimal glands: Secondary | ICD-10-CM | POA: Diagnosis not present

## 2023-07-09 DIAGNOSIS — H35413 Lattice degeneration of retina, bilateral: Secondary | ICD-10-CM | POA: Diagnosis not present

## 2023-08-06 DIAGNOSIS — Z0000 Encounter for general adult medical examination without abnormal findings: Secondary | ICD-10-CM | POA: Diagnosis not present

## 2023-08-06 DIAGNOSIS — Z13220 Encounter for screening for lipoid disorders: Secondary | ICD-10-CM | POA: Diagnosis not present

## 2023-08-06 DIAGNOSIS — E039 Hypothyroidism, unspecified: Secondary | ICD-10-CM | POA: Diagnosis not present

## 2023-08-06 DIAGNOSIS — Z136 Encounter for screening for cardiovascular disorders: Secondary | ICD-10-CM | POA: Diagnosis not present

## 2023-09-14 DIAGNOSIS — E039 Hypothyroidism, unspecified: Secondary | ICD-10-CM | POA: Diagnosis not present

## 2023-09-28 DIAGNOSIS — R69 Illness, unspecified: Secondary | ICD-10-CM | POA: Diagnosis not present

## 2023-10-19 DIAGNOSIS — L565 Disseminated superficial actinic porokeratosis (DSAP): Secondary | ICD-10-CM | POA: Diagnosis not present

## 2023-10-19 DIAGNOSIS — M2011 Hallux valgus (acquired), right foot: Secondary | ICD-10-CM | POA: Diagnosis not present

## 2023-10-19 DIAGNOSIS — M2042 Other hammer toe(s) (acquired), left foot: Secondary | ICD-10-CM | POA: Diagnosis not present

## 2023-10-19 DIAGNOSIS — M65871 Other synovitis and tenosynovitis, right ankle and foot: Secondary | ICD-10-CM | POA: Diagnosis not present

## 2023-10-19 DIAGNOSIS — M792 Neuralgia and neuritis, unspecified: Secondary | ICD-10-CM | POA: Diagnosis not present

## 2023-10-19 DIAGNOSIS — M2012 Hallux valgus (acquired), left foot: Secondary | ICD-10-CM | POA: Diagnosis not present

## 2023-10-19 DIAGNOSIS — M2041 Other hammer toe(s) (acquired), right foot: Secondary | ICD-10-CM | POA: Diagnosis not present

## 2023-10-23 DIAGNOSIS — R69 Illness, unspecified: Secondary | ICD-10-CM | POA: Diagnosis not present

## 2023-10-30 ENCOUNTER — Ambulatory Visit
Admission: RE | Admit: 2023-10-30 | Discharge: 2023-10-30 | Disposition: A | Discharge status: Home / Self Care | Payer: Medicare HMO | Source: Ambulatory Visit | Attending: Obstetrics and Gynecology | Admitting: Obstetrics and Gynecology

## 2023-10-30 DIAGNOSIS — E2839 Other primary ovarian failure: Secondary | ICD-10-CM | POA: Diagnosis not present

## 2023-10-30 DIAGNOSIS — M8588 Other specified disorders of bone density and structure, other site: Secondary | ICD-10-CM | POA: Diagnosis not present

## 2023-10-30 DIAGNOSIS — N958 Other specified menopausal and perimenopausal disorders: Secondary | ICD-10-CM | POA: Diagnosis not present

## 2023-10-30 DIAGNOSIS — M8580 Other specified disorders of bone density and structure, unspecified site: Secondary | ICD-10-CM

## 2023-11-14 ENCOUNTER — Other Ambulatory Visit: Payer: Self-pay | Admitting: Medical Genetics

## 2023-12-08 ENCOUNTER — Other Ambulatory Visit (HOSPITAL_COMMUNITY)
Admission: RE | Admit: 2023-12-08 | Discharge: 2023-12-08 | Disposition: A | Discharge status: Home / Self Care | Payer: Self-pay | Source: Ambulatory Visit | Attending: Oncology | Admitting: Oncology

## 2023-12-10 DIAGNOSIS — M654 Radial styloid tenosynovitis [de Quervain]: Secondary | ICD-10-CM | POA: Diagnosis not present

## 2023-12-18 LAB — GENECONNECT MOLECULAR SCREEN: Genetic Analysis Overall Interpretation: NEGATIVE

## 2024-01-14 DIAGNOSIS — H0102B Squamous blepharitis left eye, upper and lower eyelids: Secondary | ICD-10-CM | POA: Diagnosis not present

## 2024-01-14 DIAGNOSIS — H0102A Squamous blepharitis right eye, upper and lower eyelids: Secondary | ICD-10-CM | POA: Diagnosis not present

## 2024-01-14 DIAGNOSIS — H04123 Dry eye syndrome of bilateral lacrimal glands: Secondary | ICD-10-CM | POA: Diagnosis not present

## 2024-01-14 DIAGNOSIS — H35413 Lattice degeneration of retina, bilateral: Secondary | ICD-10-CM | POA: Diagnosis not present

## 2024-01-14 DIAGNOSIS — H33302 Unspecified retinal break, left eye: Secondary | ICD-10-CM | POA: Diagnosis not present

## 2024-01-14 DIAGNOSIS — H43813 Vitreous degeneration, bilateral: Secondary | ICD-10-CM | POA: Diagnosis not present

## 2024-01-14 DIAGNOSIS — Z961 Presence of intraocular lens: Secondary | ICD-10-CM | POA: Diagnosis not present

## 2024-01-26 ENCOUNTER — Other Ambulatory Visit: Payer: Self-pay | Admitting: Obstetrics and Gynecology

## 2024-01-26 DIAGNOSIS — Z1231 Encounter for screening mammogram for malignant neoplasm of breast: Secondary | ICD-10-CM

## 2024-03-25 ENCOUNTER — Ambulatory Visit
Admission: RE | Admit: 2024-03-25 | Discharge: 2024-03-25 | Disposition: A | Discharge status: Home / Self Care | Source: Ambulatory Visit | Attending: Obstetrics and Gynecology | Admitting: Obstetrics and Gynecology

## 2024-03-25 DIAGNOSIS — Z1231 Encounter for screening mammogram for malignant neoplasm of breast: Secondary | ICD-10-CM

## 2024-04-05 DIAGNOSIS — R87810 Cervical high risk human papillomavirus (HPV) DNA test positive: Secondary | ICD-10-CM | POA: Diagnosis not present

## 2024-04-20 DIAGNOSIS — E559 Vitamin D deficiency, unspecified: Secondary | ICD-10-CM | POA: Diagnosis not present

## 2024-07-26 DIAGNOSIS — Z961 Presence of intraocular lens: Secondary | ICD-10-CM | POA: Diagnosis not present

## 2024-07-26 DIAGNOSIS — H0102B Squamous blepharitis left eye, upper and lower eyelids: Secondary | ICD-10-CM | POA: Diagnosis not present

## 2024-07-26 DIAGNOSIS — H35413 Lattice degeneration of retina, bilateral: Secondary | ICD-10-CM | POA: Diagnosis not present

## 2024-07-26 DIAGNOSIS — H0102A Squamous blepharitis right eye, upper and lower eyelids: Secondary | ICD-10-CM | POA: Diagnosis not present

## 2024-07-26 DIAGNOSIS — H43813 Vitreous degeneration, bilateral: Secondary | ICD-10-CM | POA: Diagnosis not present

## 2024-07-26 DIAGNOSIS — H33302 Unspecified retinal break, left eye: Secondary | ICD-10-CM | POA: Diagnosis not present

## 2024-07-26 DIAGNOSIS — H04123 Dry eye syndrome of bilateral lacrimal glands: Secondary | ICD-10-CM | POA: Diagnosis not present

## 2024-08-29 DIAGNOSIS — E039 Hypothyroidism, unspecified: Secondary | ICD-10-CM | POA: Diagnosis not present

## 2024-08-29 DIAGNOSIS — E7800 Pure hypercholesterolemia, unspecified: Secondary | ICD-10-CM | POA: Diagnosis not present

## 2024-08-30 ENCOUNTER — Other Ambulatory Visit (HOSPITAL_BASED_OUTPATIENT_CLINIC_OR_DEPARTMENT_OTHER): Payer: Self-pay | Admitting: Family Medicine

## 2024-08-30 DIAGNOSIS — E7800 Pure hypercholesterolemia, unspecified: Secondary | ICD-10-CM

## 2024-09-19 ENCOUNTER — Ambulatory Visit (HOSPITAL_BASED_OUTPATIENT_CLINIC_OR_DEPARTMENT_OTHER)
Admission: RE | Admit: 2024-09-19 | Discharge: 2024-09-19 | Disposition: A | Discharge status: Home / Self Care | Payer: Self-pay | Source: Ambulatory Visit | Attending: Family Medicine | Admitting: Family Medicine

## 2024-09-19 DIAGNOSIS — E7800 Pure hypercholesterolemia, unspecified: Secondary | ICD-10-CM
